# Patient Record
Sex: Female | Born: 1990 | Race: White | Hispanic: No | Marital: Single
Health system: Southern US, Community
[De-identification: ages and names within clinical notes are randomized; demographics above are authoritative.]

---

## 2020-12-06 ENCOUNTER — Other Ambulatory Visit: Payer: Self-pay

## 2020-12-06 ENCOUNTER — Emergency Department (HOSPITAL_COMMUNITY): Payer: Medicare Other

## 2020-12-06 ENCOUNTER — Emergency Department (HOSPITAL_COMMUNITY)
Admission: EM | Admit: 2020-12-06 | Discharge: 2020-12-07 | Disposition: A | Discharge status: Home / Self Care | Payer: Medicare Other | Attending: Emergency Medicine | Admitting: Emergency Medicine

## 2020-12-06 DIAGNOSIS — R109 Unspecified abdominal pain: Secondary | ICD-10-CM | POA: Diagnosis not present

## 2020-12-06 DIAGNOSIS — Z20822 Contact with and (suspected) exposure to covid-19: Secondary | ICD-10-CM | POA: Insufficient documentation

## 2020-12-06 DIAGNOSIS — W1812XA Fall from or off toilet with subsequent striking against object, initial encounter: Secondary | ICD-10-CM | POA: Insufficient documentation

## 2020-12-06 DIAGNOSIS — R519 Headache, unspecified: Secondary | ICD-10-CM | POA: Diagnosis not present

## 2020-12-06 DIAGNOSIS — M542 Cervicalgia: Secondary | ICD-10-CM | POA: Diagnosis present

## 2020-12-06 DIAGNOSIS — R29810 Facial weakness: Secondary | ICD-10-CM | POA: Insufficient documentation

## 2020-12-06 DIAGNOSIS — R110 Nausea: Secondary | ICD-10-CM | POA: Insufficient documentation

## 2020-12-06 DIAGNOSIS — R531 Weakness: Secondary | ICD-10-CM | POA: Diagnosis not present

## 2020-12-06 DIAGNOSIS — M061 Adult-onset Still's disease: Secondary | ICD-10-CM | POA: Insufficient documentation

## 2020-12-06 DIAGNOSIS — G43109 Migraine with aura, not intractable, without status migrainosus: Secondary | ICD-10-CM | POA: Diagnosis not present

## 2020-12-06 LAB — CBG MONITORING, ED: Glucose-Capillary: 128 mg/dL — ABNORMAL HIGH (ref 70–99)

## 2020-12-06 IMAGING — MR MR MRA HEAD W/O CM
3 series · 18 of 48 positions shown · non-contrast
Comparison: No pertinent prior exam.

Comparison made with prior head CT from earlier the same day.

CLINICAL DATA: Follow-up examination for acute stroke. Left-sided
weakness.

EXAM:
MRI HEAD WITHOUT CONTRAST
MRA HEAD WITHOUT CONTRAST
TECHNIQUE: Multiplanar, multi-echo pulse sequences of the brain and surrounding
structures were acquired without intravenous contrast. Angiographic
images of the Circle of Willis were acquired using MRA technique
without intravenous contrast.

[Series 2: ax (id) (id) · axial · 1.6mm · 0.43mm/px · z∈[-8,+87]mm · 11 of 134 slices shown]
[im 7/134]
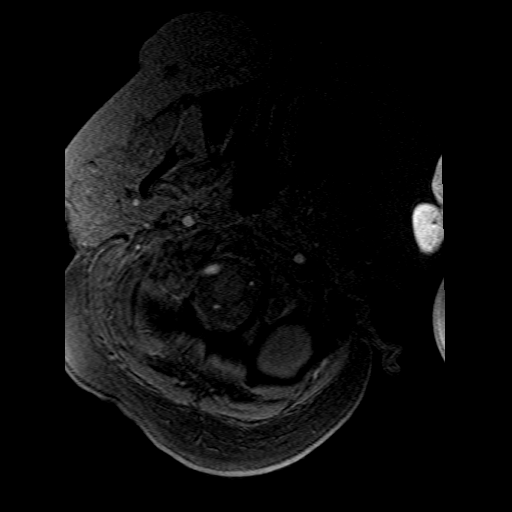
[im 20/134]
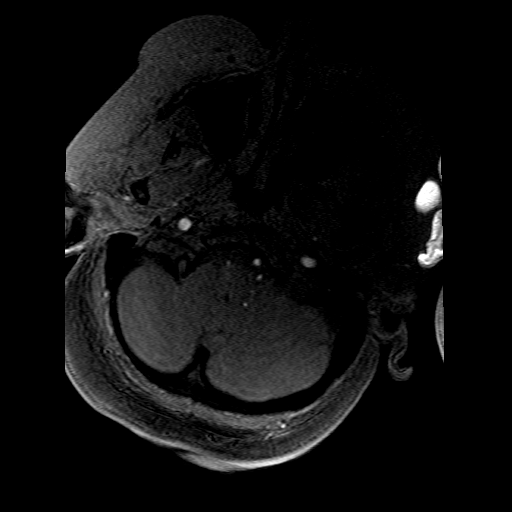
[im 24/134]
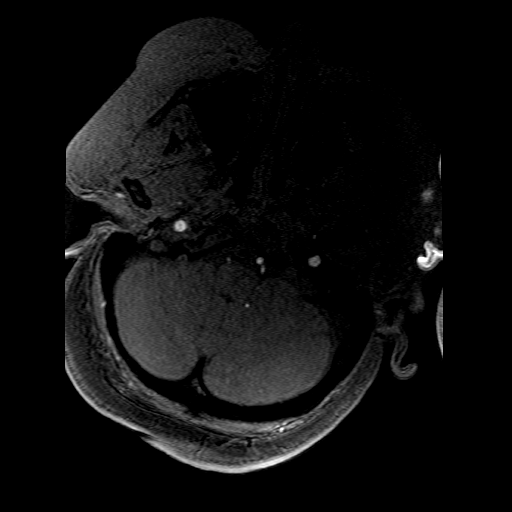
[im 40/134]
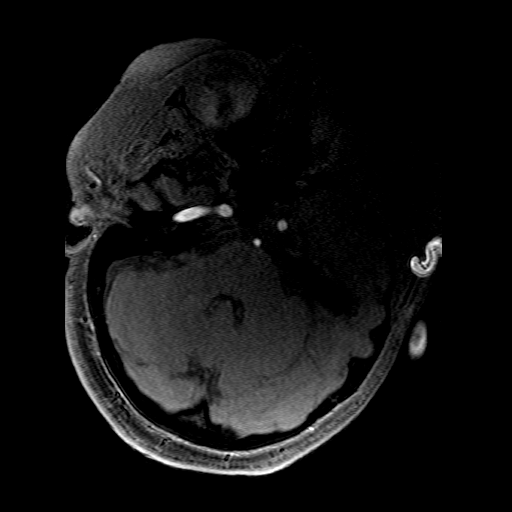
[im 57/134]
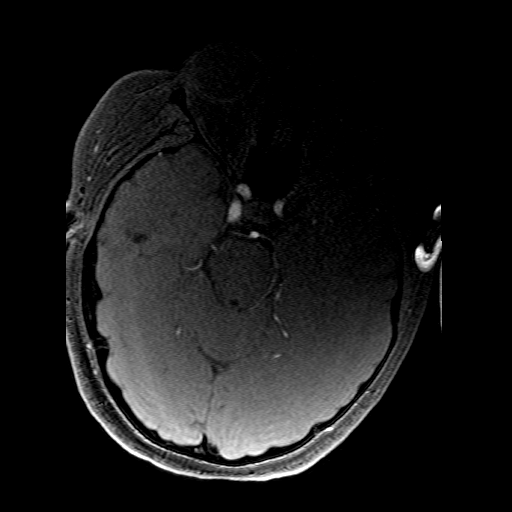
[im 67/134]
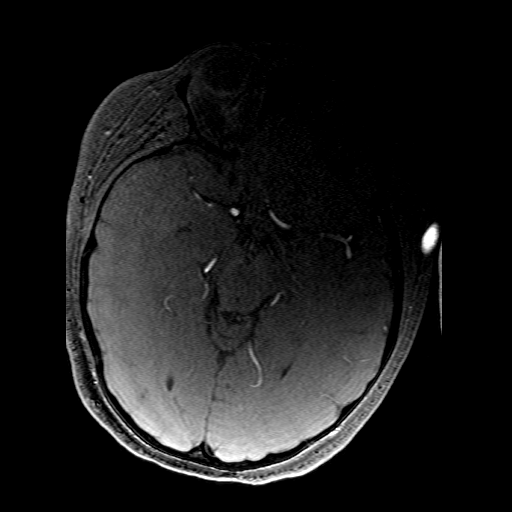
[im 77/134]
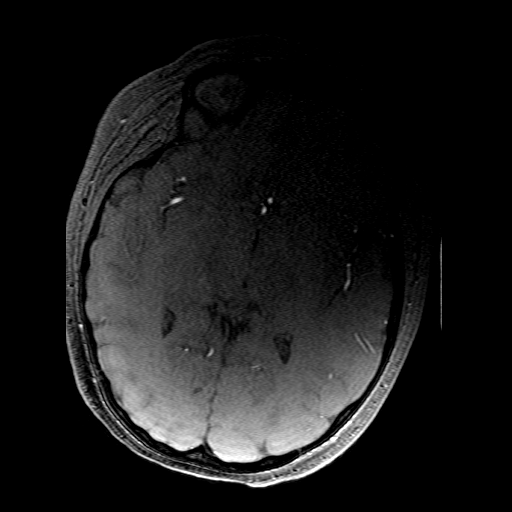
[im 94/134]
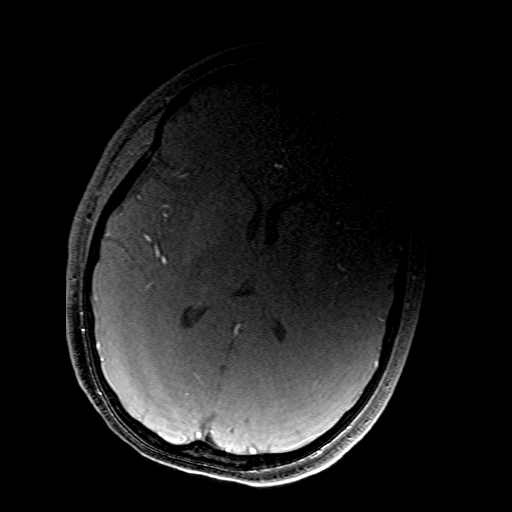
[im 110/134]
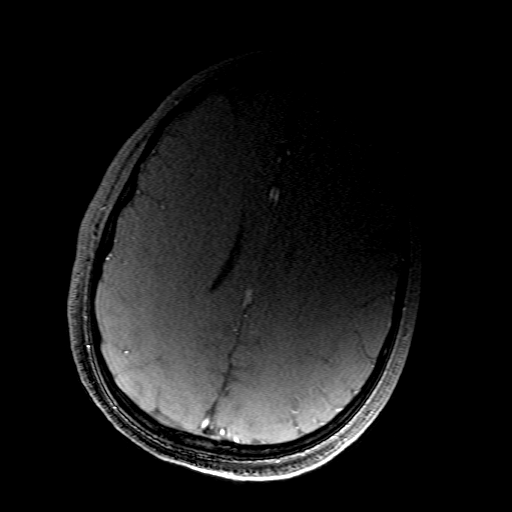
[im 114/134]
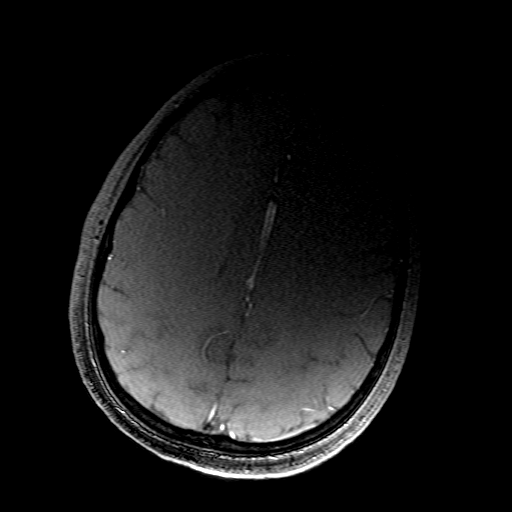
[im 127/134]
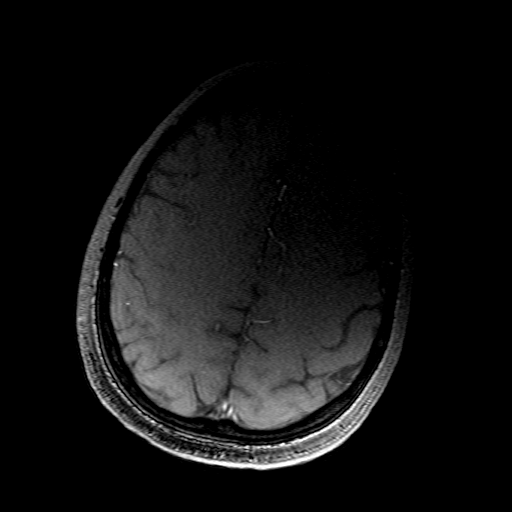

[Series 200: col:ax (id) (id) · axial · 1.6mm · 0.43mm/px · 1 of 1 slices shown]
[im 1/1]
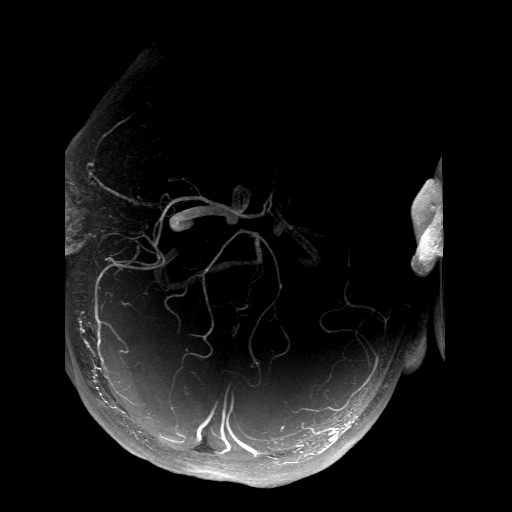

[Series 201: pjn:ax (id) (id) · sagittal · 1.6mm · 0.43mm/px · 6 of 19 slices shown]
[im 1/19]
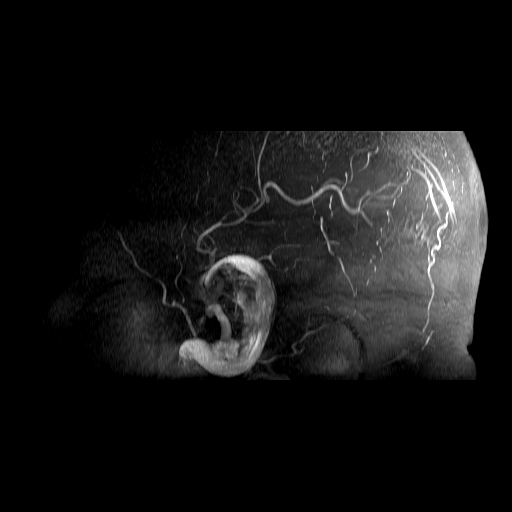
[im 4/19]
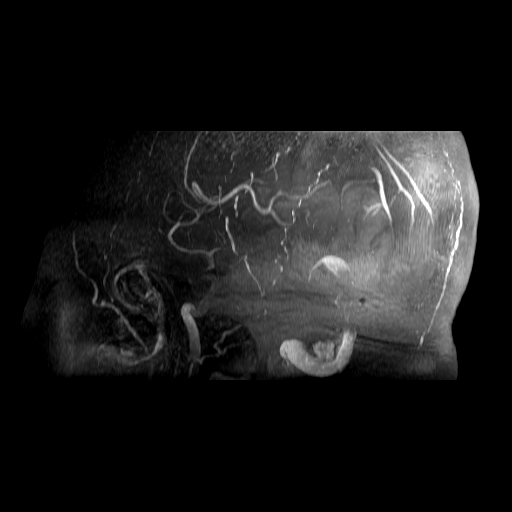
[im 8/19]
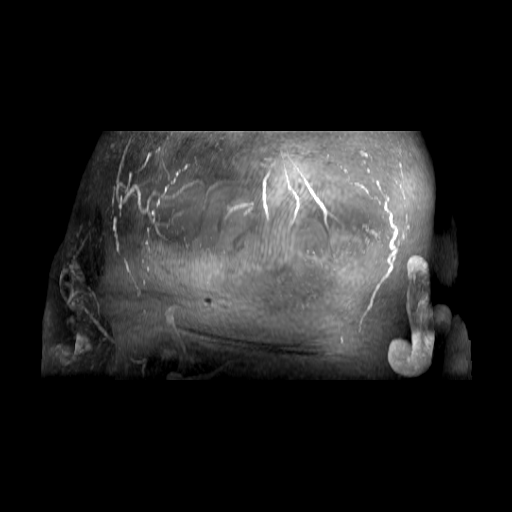
[im 11/19]
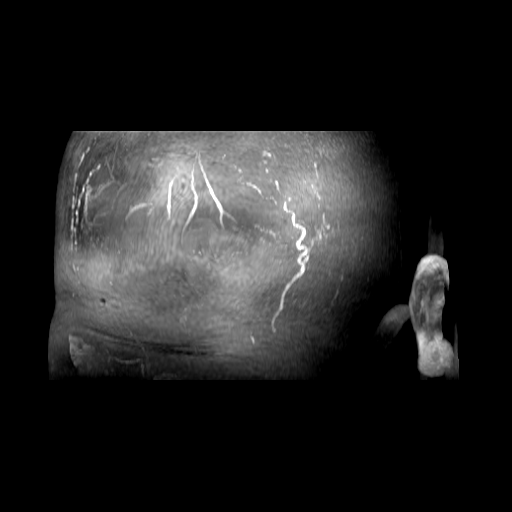
[im 15/19]
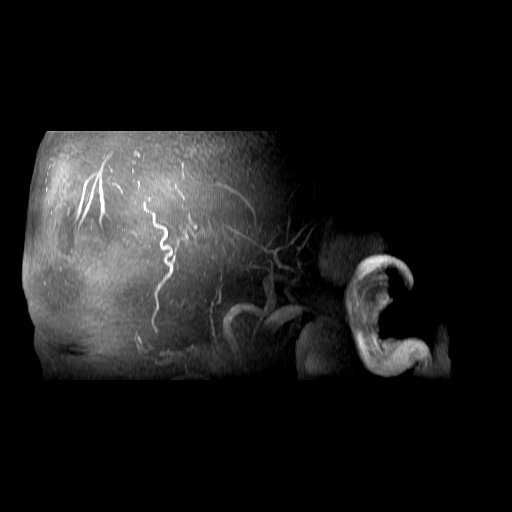
[im 19/19]
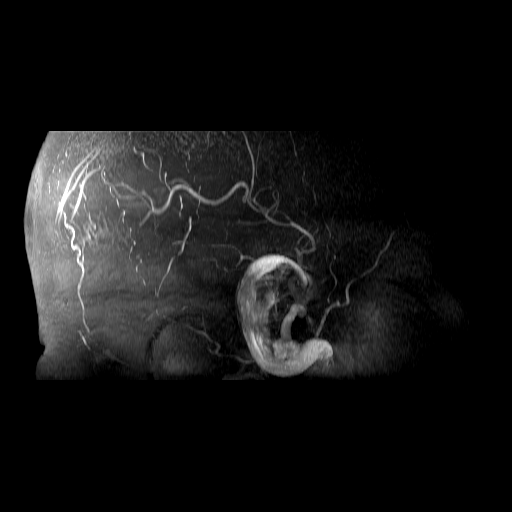

[18 of 48 positions shown; findings below may reference images not displayed]

FINDINGS: MRI HEAD FINDINGS

Brain: Examination is somewhat technically limited due to the
necessity of using a flex coil for this examination. Additionally, a
limited stroke protocol with diffusion weighted imaging, FLAIR, and
sagittal T1 weighted sequences only performed.

Diffusion weighted sequences demonstrate no evidence for acute or
subacute infarct. Gray-white matter differentiation well maintained.
No encephalomalacia to suggest chronic cortical infarction. No
secondary signs to suggest acute intracranial hemorrhage. Cerebral
volume within normal limits. A few apparent subcentimeter foci of
FLAIR hyperintensity involving the anterior frontal regions noted,
suspected to be artifactual on this limited exam. No other
significant cerebral white matter disease.

No mass lesion, midline shift or mass effect. No hydrocephalus or
extra-axial fluid collection. Pituitary gland and suprasellar region
within normal limits. Midline structures intact and normal.

Vascular: Major intracranial vascular flow voids are grossly
maintained.

Skull and upper cervical spine: Craniocervical junction within
normal limits. Bone marrow signal intensity normal. No scalp soft
tissue abnormality.

Sinuses/Orbits: Globes and orbital soft tissues grossly within
normal limits. Paranasal sinuses are clear. No mastoid effusion.

Other: None.

MRA HEAD FINDINGS

Anterior circulation: Examination somewhat technically limited by
the use of the flex coil.

Visualized distal cervical segments of the internal carotid arteries
are patent with symmetric antegrade flow. Petrous, cavernous, and
supraclinoid segments widely patent without stenosis or other
abnormality. A1 segments widely patent. Normal anterior
communicating artery complex. Anterior cerebral arteries patent to
their distal aspects without stenosis. Normal in stenosis or
occlusion. No proximal MCA branch occlusion. Distal MCA branches
perfused and symmetric.

Posterior circulation: Right vertebral artery strongly dominant and
widely patent to the vertebrobasilar junction. Hypoplastic left
vertebral artery patent as well. Right PICA origin patent. Left PICA
origin not well seen on this exam. Basilar patent to its distal
aspect without stenosis. Superior cerebellar arteries patent
bilaterally. Both PCAs primarily supplied via the basilar well
perfused or distal aspects. Small right posterior communicating
artery noted.

Anatomic variants: Dominant right vertebral artery. No aneurysm or
other vascular abnormality.
IMPRESSION: 1. Negative brain MRI. No acute intracranial infarct or other
abnormality.
2. Normal intracranial MRA. No large vessel occlusion,
hemodynamically significant stenosis, or other significant finding.

## 2020-12-06 IMAGING — CT CT CERVICAL SPINE W/O CM
3 of 4 series · 13 of 33 positions shown, 16 images · non-contrast
Comparison: None.

CLINICAL DATA: Status post trauma.

EXAM:
CT CERVICAL SPINE WITHOUT CONTRAST
TECHNIQUE: Multidetector CT imaging of the cervical spine was performed without
intravenous contrast. Multiplanar CT image reconstructions were also
generated.

[Series 4: c_spine 2.0 st · axial · 0.27mm/px · z∈[-293,-157]mm · 5 of 103 slices shown, 7 images]
[im 18/103  soft-tissue]
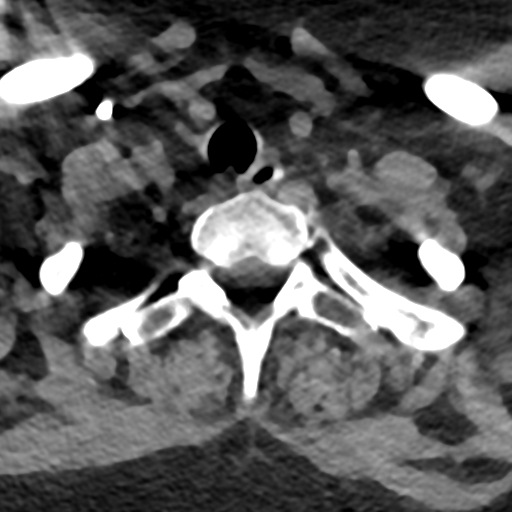
[im 18/103  bone]
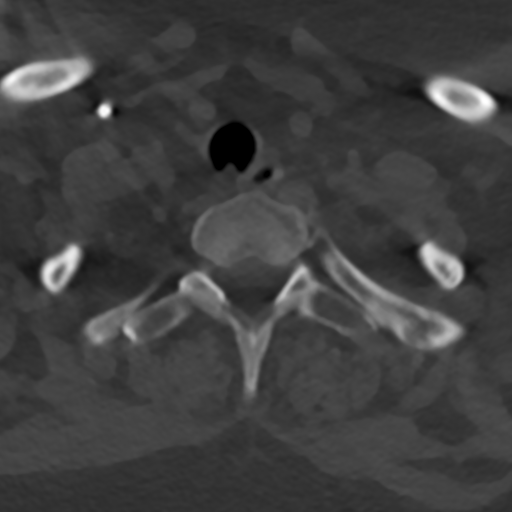
[im 35/103  bone]
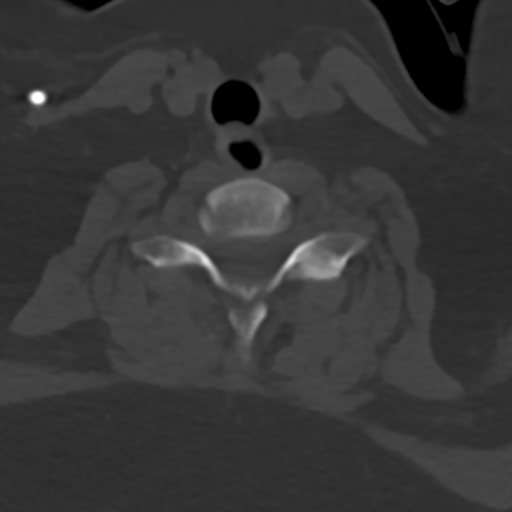
[im 52/103  bone]
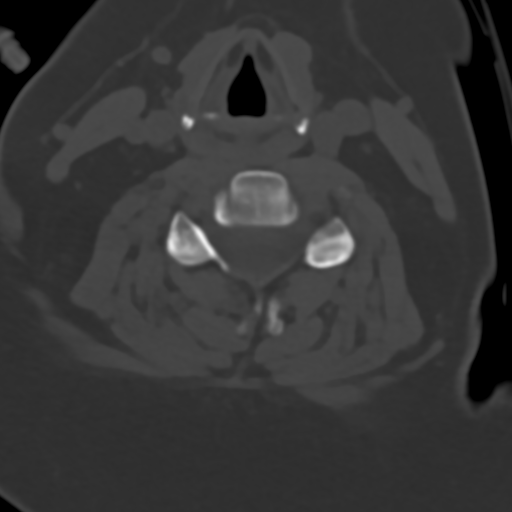
[im 69/103  bone]
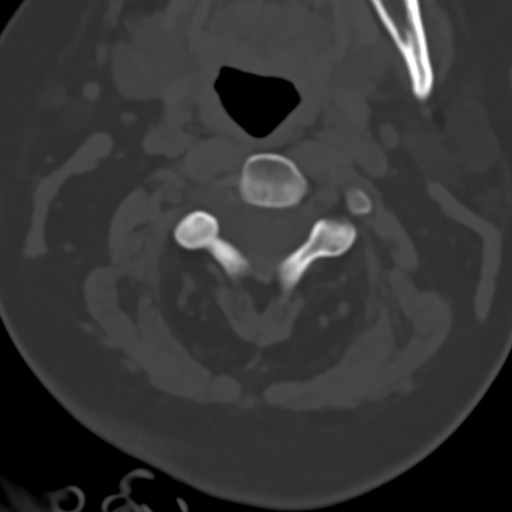
[im 86/103  soft-tissue]
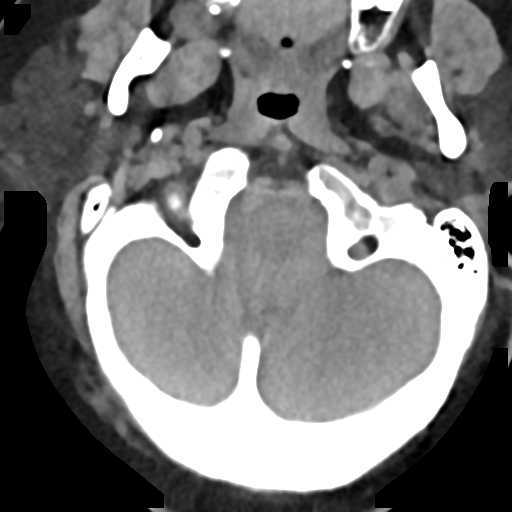
[im 86/103  bone]
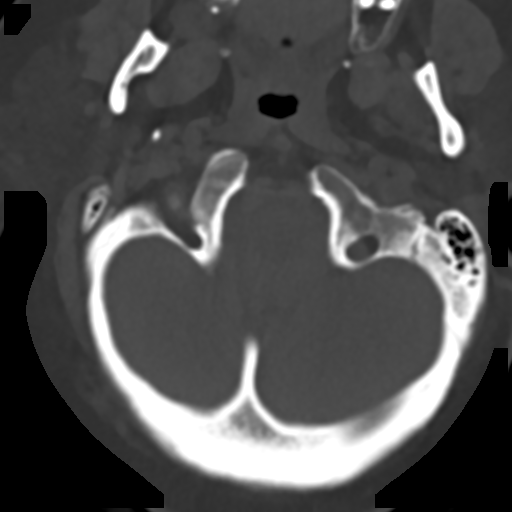

[Series 8: c_spine 2.0 sag bone · sagittal · 0.30mm/px · 5 of 48 slices shown, 6 images]
[im 16/48  bone]
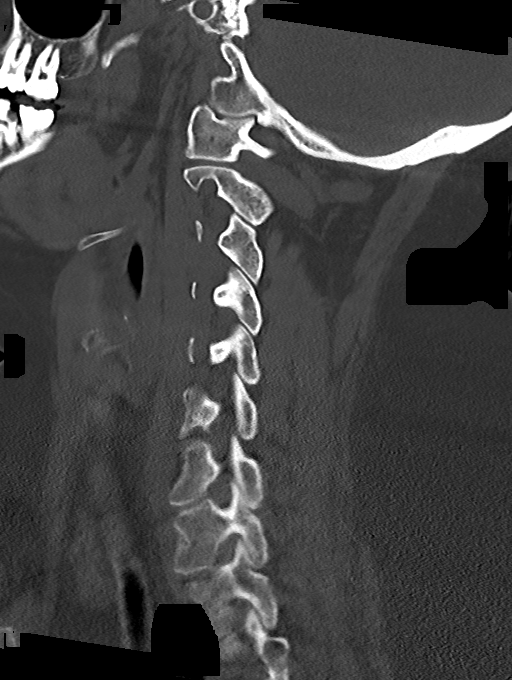
[im 20/48  bone]
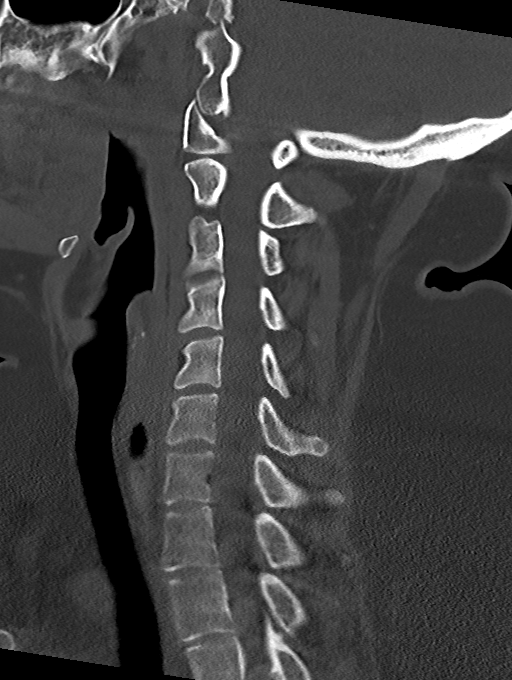
[im 24/48  soft-tissue]
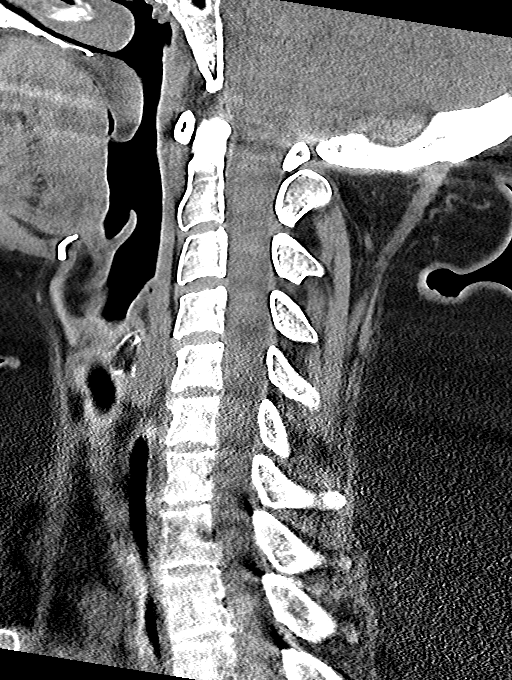
[im 24/48  bone]
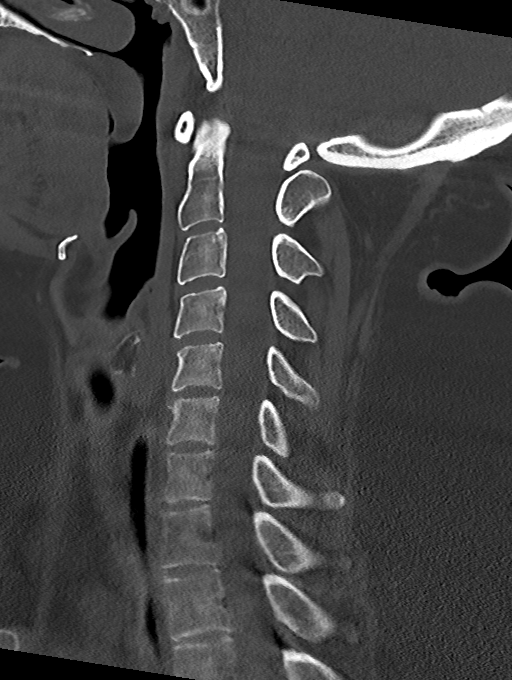
[im 28/48  bone]
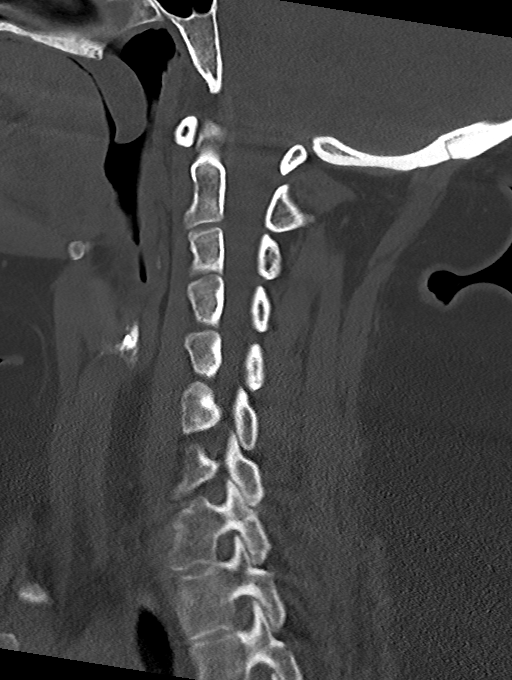
[im 32/48  bone]
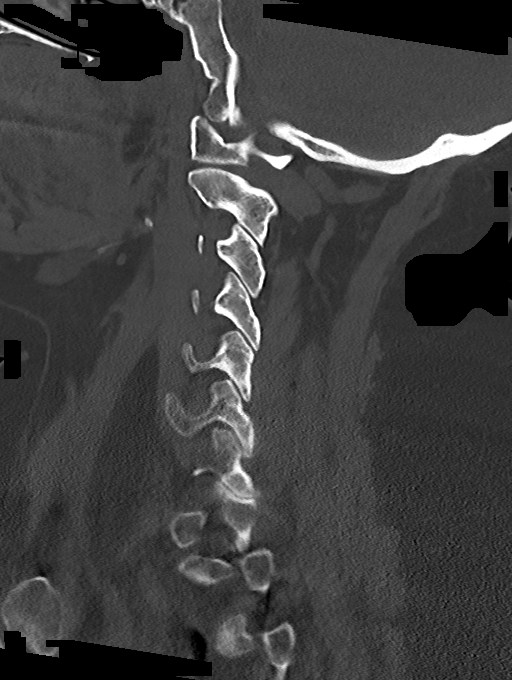

[Series 9: c_spine 2.0 cor bone · coronal · 0.30mm/px · 3 of 51 slices shown]
[im 11/51  bone]
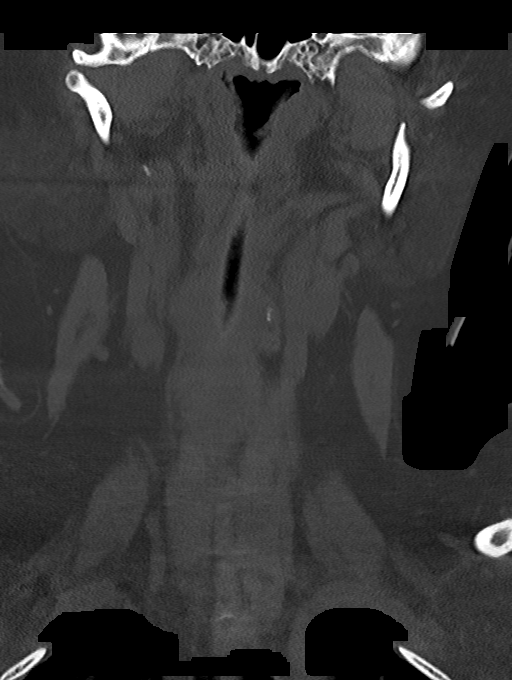
[im 21/51  bone]
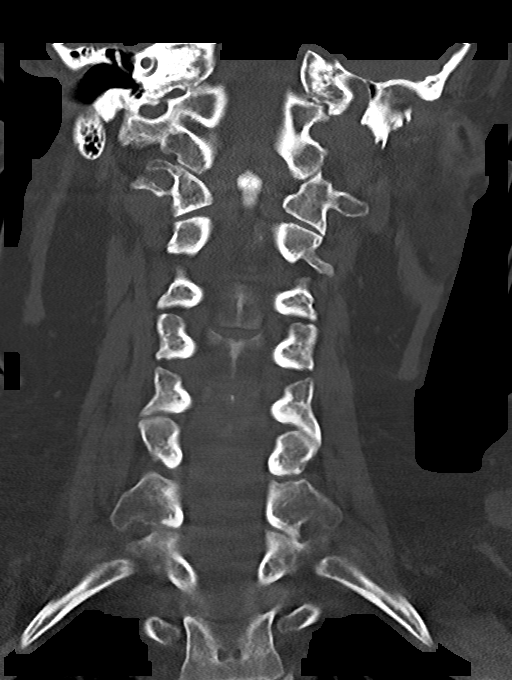
[im 31/51  bone]
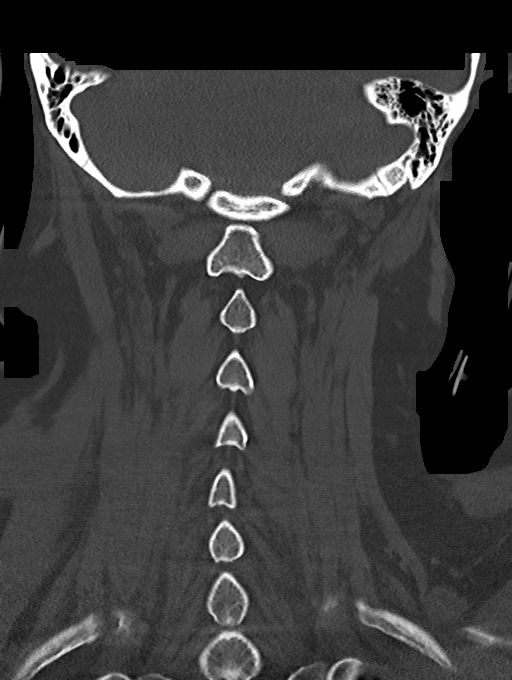

[13 of 33 positions shown; findings below may reference images not displayed]

FINDINGS: Alignment: Normal.

Skull base and vertebrae: No acute fracture. No primary bone lesion
or focal pathologic process.

Soft tissues and spinal canal: No prevertebral fluid or swelling. No
visible canal hematoma.

Disc levels: Normal multilevel endplates are seen with normal
multilevel intervertebral disc spaces.

Normal bilateral multilevel facet joints are noted.

Upper chest: Negative.

Other: None.
IMPRESSION: No acute fracture or subluxation of the cervical spine.

## 2020-12-06 IMAGING — CT CT HEAD CODE STROKE
4 series · 16 of 47 positions shown, 18 images · non-contrast
Comparison: None.

CLINICAL DATA: Code stroke. Initial evaluation for acute stroke,
left-sided deficits.

EXAM:
CT HEAD WITHOUT CONTRAST
TECHNIQUE: Contiguous axial images were obtained from the base of the skull
through the vertex without intravenous contrast.

[Series 3: head wo · axial · 0.41mm/px · z∈[-116,+4]mm · 7 of 34 slices shown, 9 images]
[im 5/34  brain]
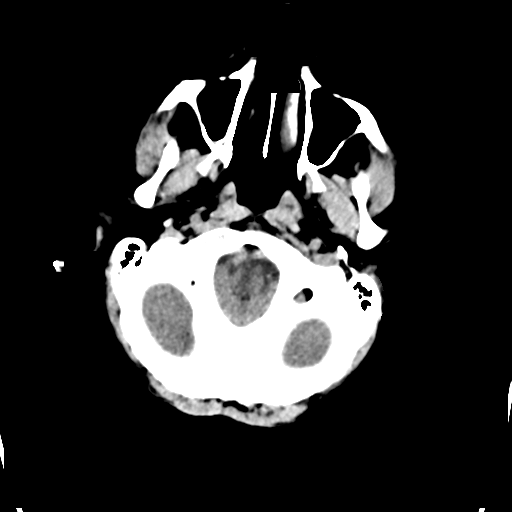
[im 5/34  bone]
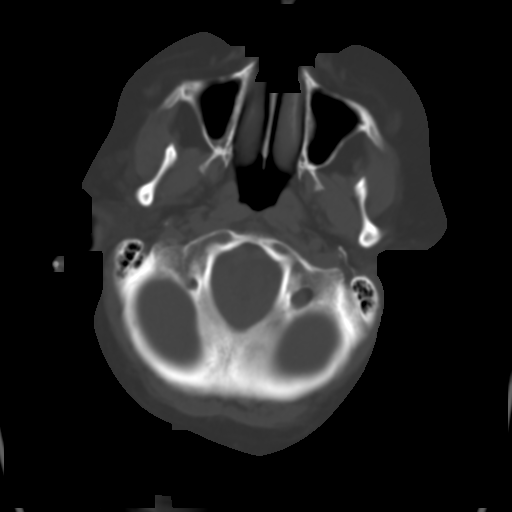
[im 9/34  brain]
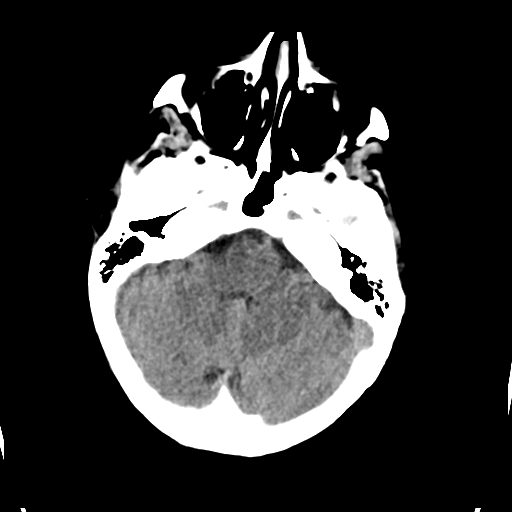
[im 13/34  brain]
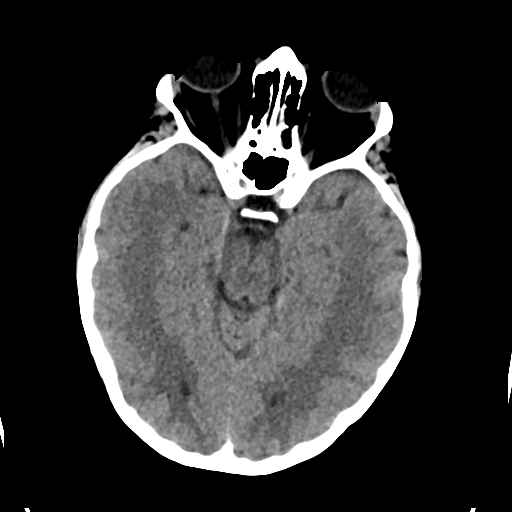
[im 17/34  brain]
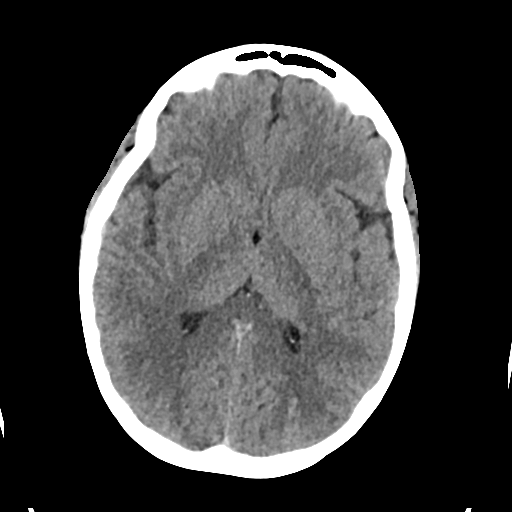
[im 21/34  brain]
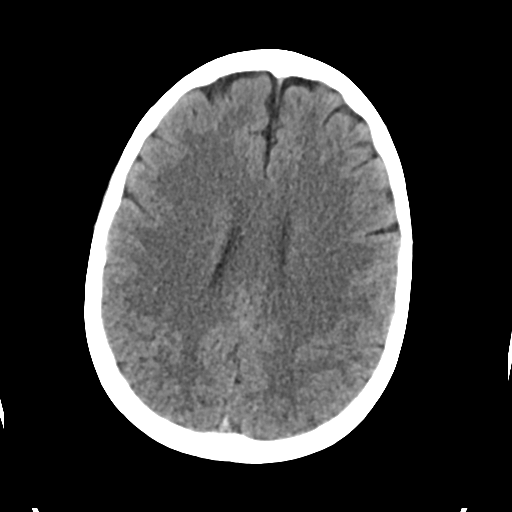
[im 21/34  bone]
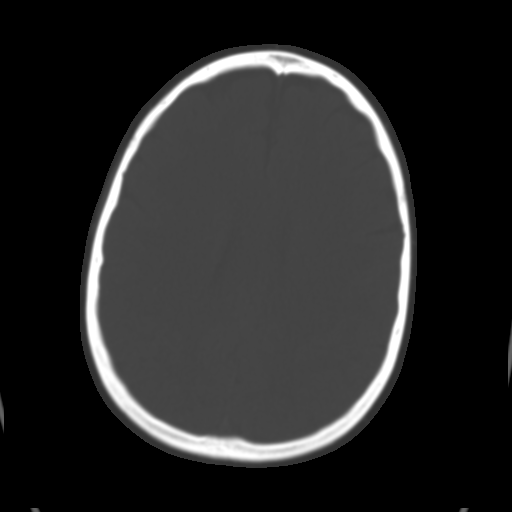
[im 25/34  brain]
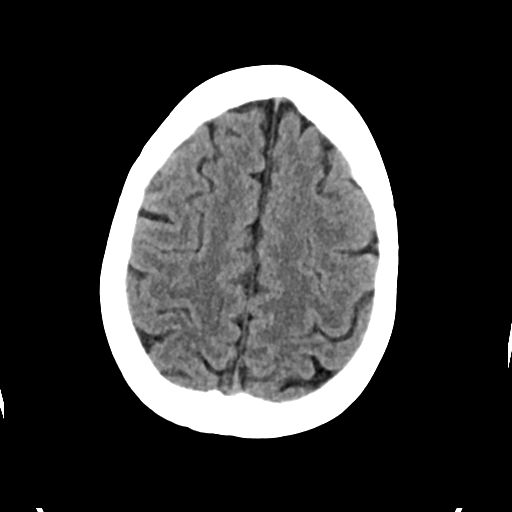
[im 29/34  brain]
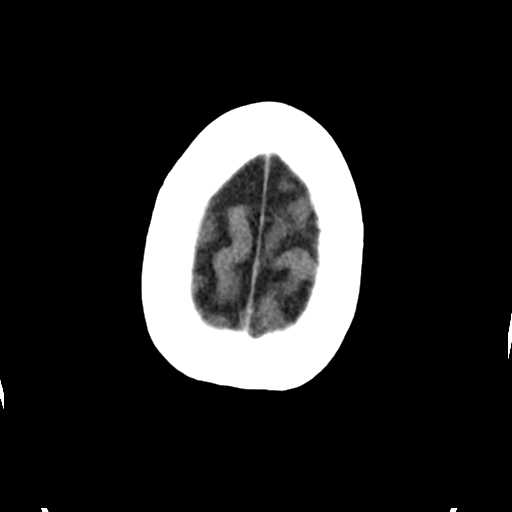

[Series 4: head bone · axial · 0.41mm/px · z∈[-120,-86]mm · 3 of 85 slices shown]
[im 9/85  bone]
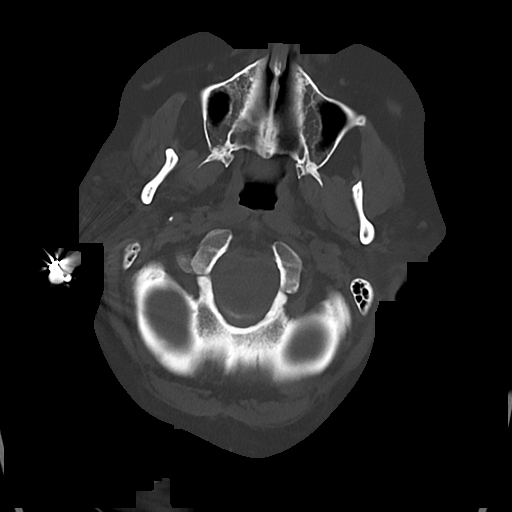
[im 17/85  bone]
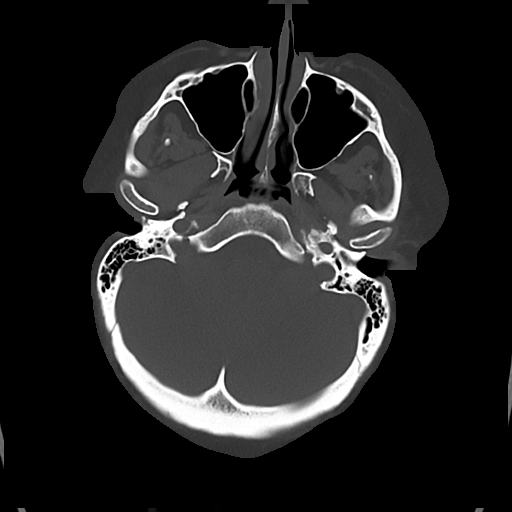
[im 26/85  bone]
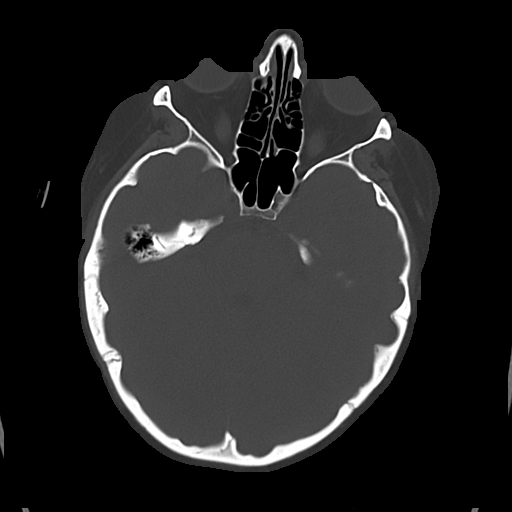

[Series 5: cor soft · coronal · 0.33mm/px · 3 of 69 slices shown]
[im 23/69  brain]
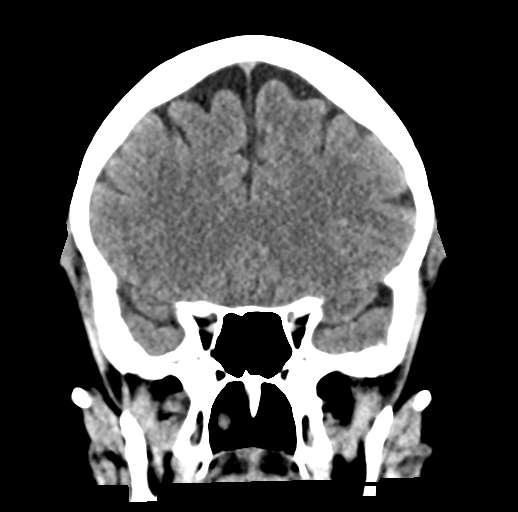
[im 31/69  brain]
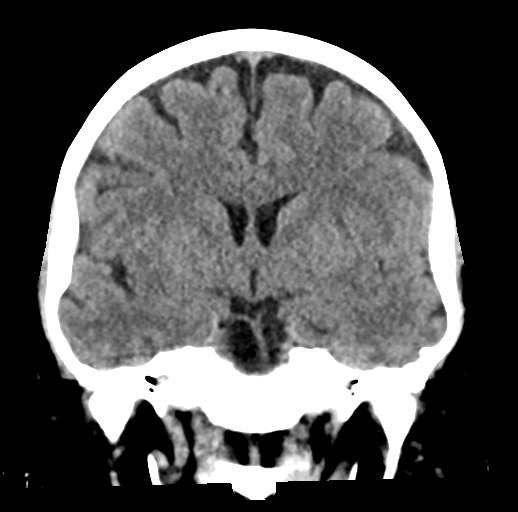
[im 38/69  brain]
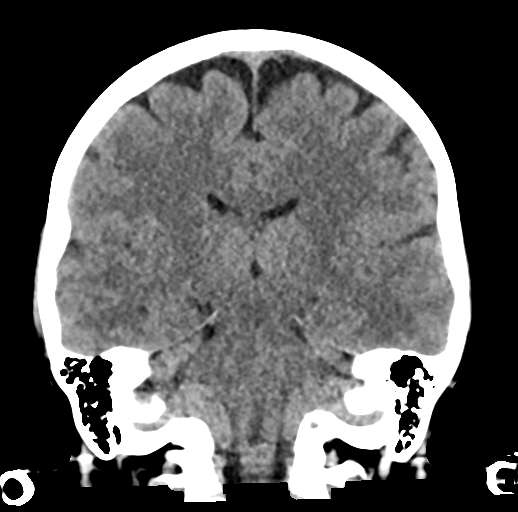

[Series 6: sag soft · sagittal · 0.33mm/px · 3 of 60 slices shown]
[im 20/60  brain]
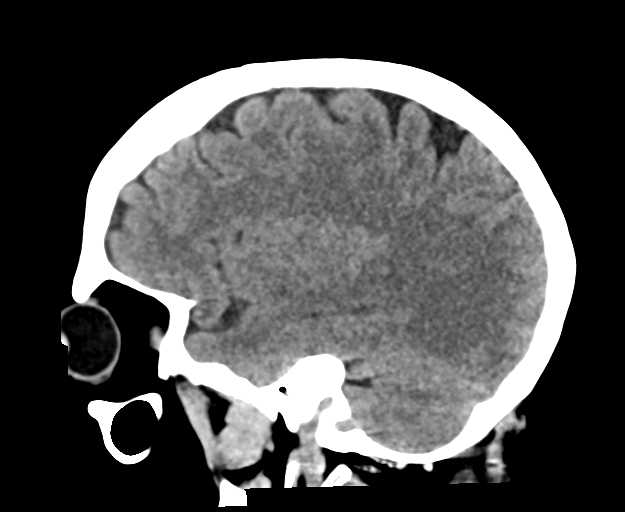
[im 30/60  brain]
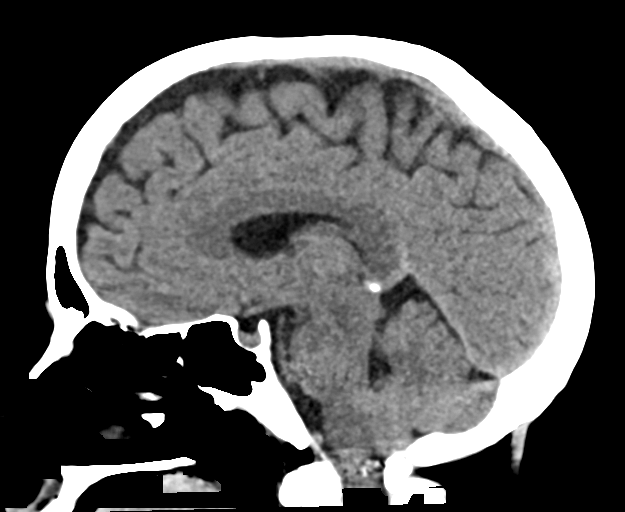
[im 40/60  brain]
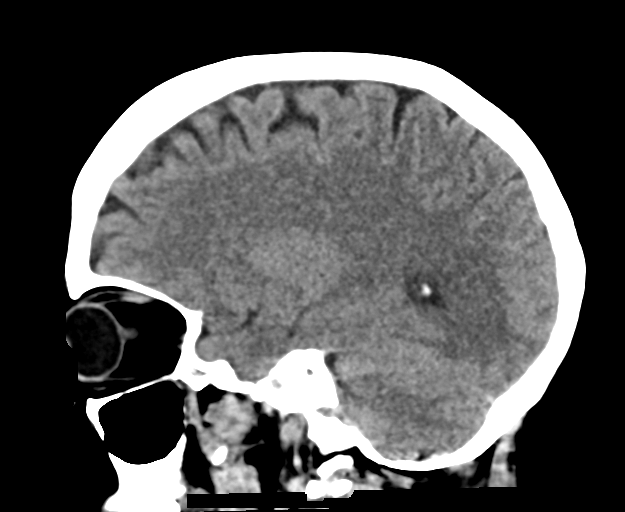

[16 of 47 positions shown; findings below may reference images not displayed]

FINDINGS: Brain: Cerebral volume within normal limits for patient age.

No evidence for acute intracranial hemorrhage. No findings to
suggest acute large vessel territory infarct. No mass lesion,
midline shift, or mass effect. Ventricles are normal in size without
evidence for hydrocephalus. No extra-axial fluid collection
identified.

Vascular: No hyperdense vessel identified.

Skull: Scalp soft tissues demonstrate no acute abnormality.
Calvarium intact.

Sinuses/Orbits: Globes and orbital soft tissues within normal
limits.

Visualized paranasal sinuses are clear. No mastoid effusion.

ASPECTS ([place] Stroke Program Early CT Score)

- Ganglionic level infarction (caudate, lentiform nuclei, internal
capsule, insula, M1-M3 cortex): 7

- Supraganglionic infarction (M4-M6 cortex): 3

Total score (0-10 with 10 being normal): 10
IMPRESSION: 1. Negative head CT.  No acute intracranial abnormality.
2. ASPECTS is 10.

These results were communicated to Dr. BLADIMIROS at [DATE] on
[DATE] by text page via the [employer] messaging system.

## 2020-12-06 IMAGING — MR MR HEAD W/O CM
4 of 6 series · 13 of 48 positions shown · non-contrast
Comparison: No pertinent prior exam.

Comparison made with prior head CT from earlier the same day.

CLINICAL DATA: Follow-up examination for acute stroke. Left-sided
weakness.

EXAM:
MRI HEAD WITHOUT CONTRAST
MRA HEAD WITHOUT CONTRAST
TECHNIQUE: Multiplanar, multi-echo pulse sequences of the brain and surrounding
structures were acquired without intravenous contrast. Angiographic
images of the Circle of Willis were acquired using MRA technique
without intravenous contrast.

[Series 2: DWI · axial · 3.0mm · 1.02mm/px · z∈[+22,+134]mm · 3 of 106 slices shown (1 of 2)]
[im 15/106]
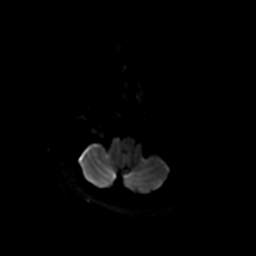
[im 57/106]
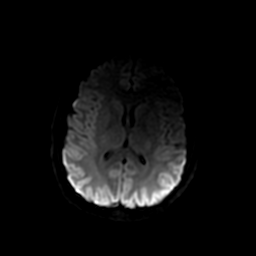
[im 92/106]
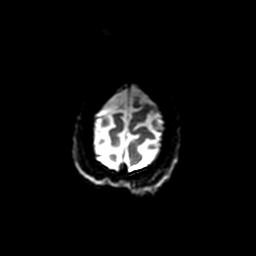

[Series 3: DWI · coronal · 5.0mm · 1.02mm/px · 3 of 74 slices shown (2 of 2)]
[im 8/74]
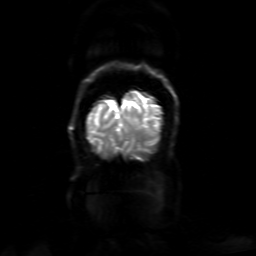
[im 37/74]
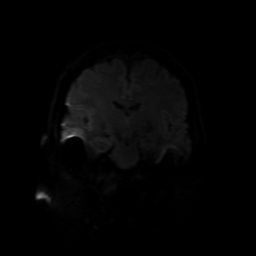
[im 66/74]
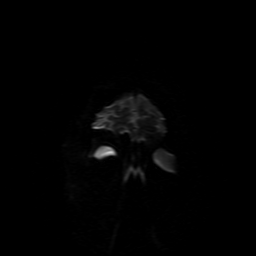

[Series 4: FLAIR · axial · 3.0mm · 0.41mm/px · z∈[+7,+153]mm · 4 of 26 slices shown (1 of 2)]
[im 1/26]
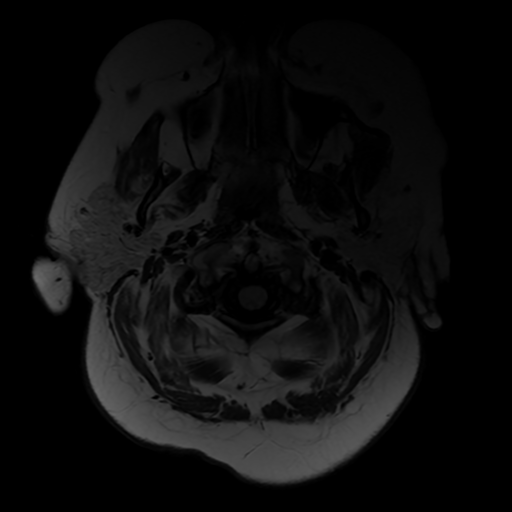
[im 9/26]
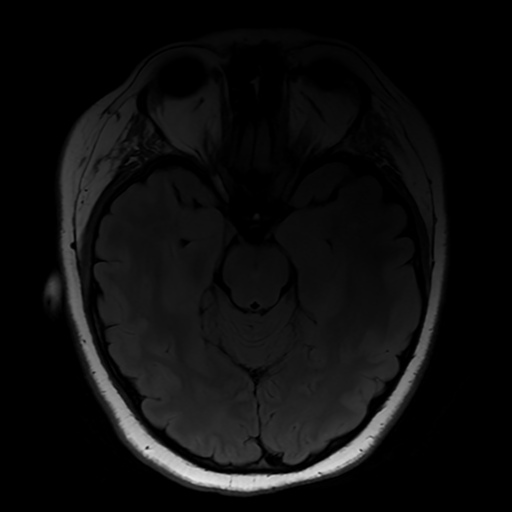
[im 17/26]
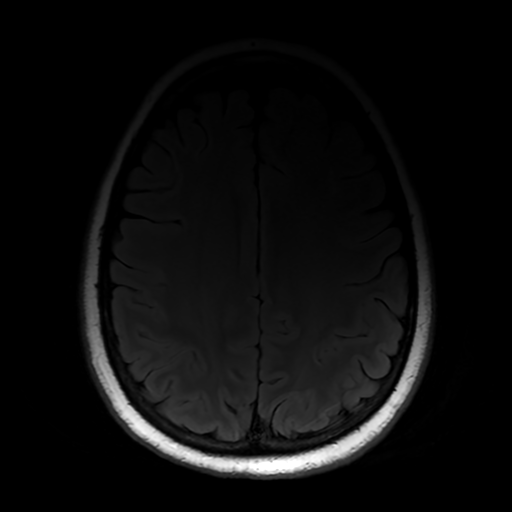
[im 26/26]
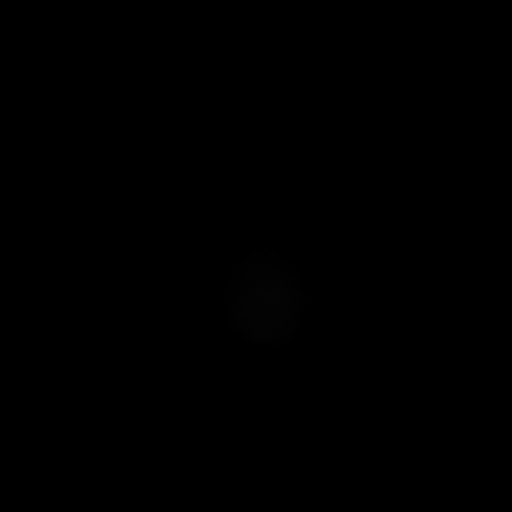

[Series 5: FLAIR · sagittal · 5.0mm · 0.23mm/px · 3 of 28 slices shown (2 of 2)]
[im 1/28]
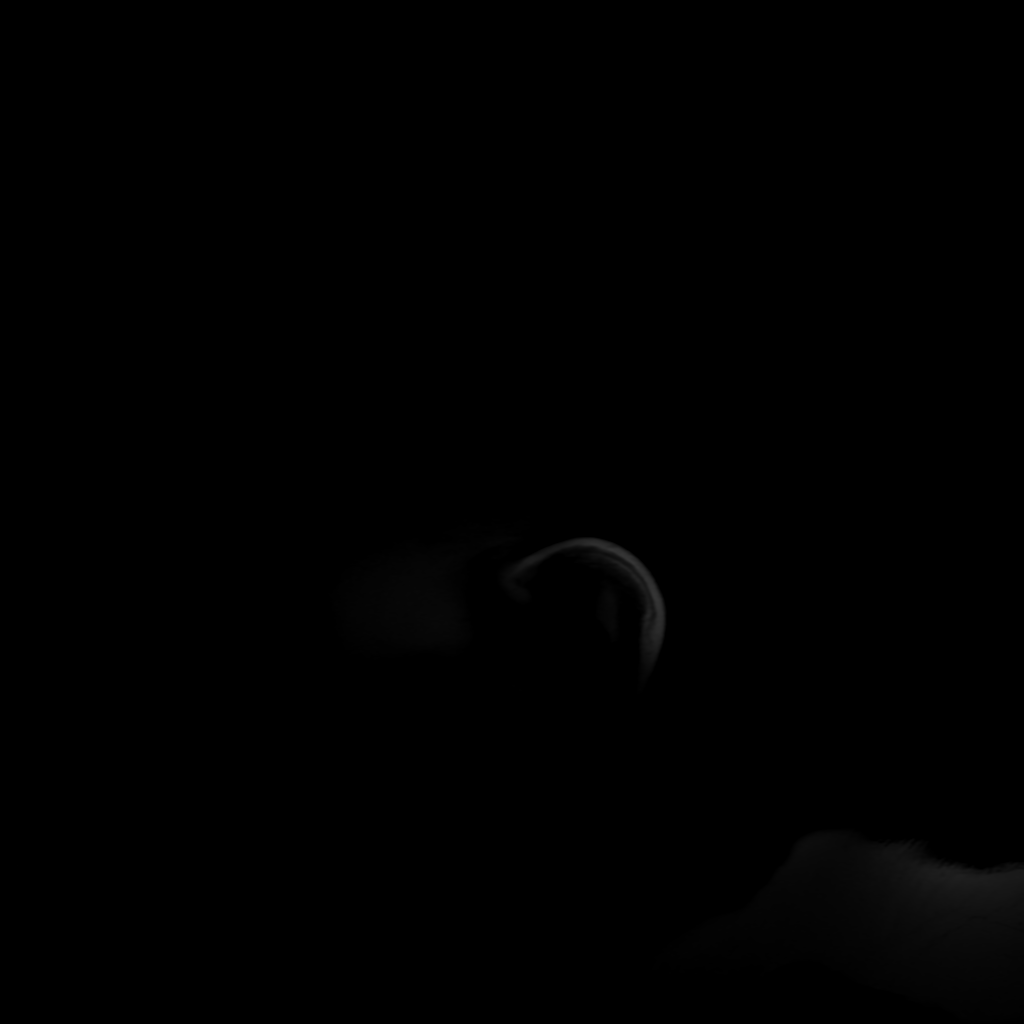
[im 19/28]
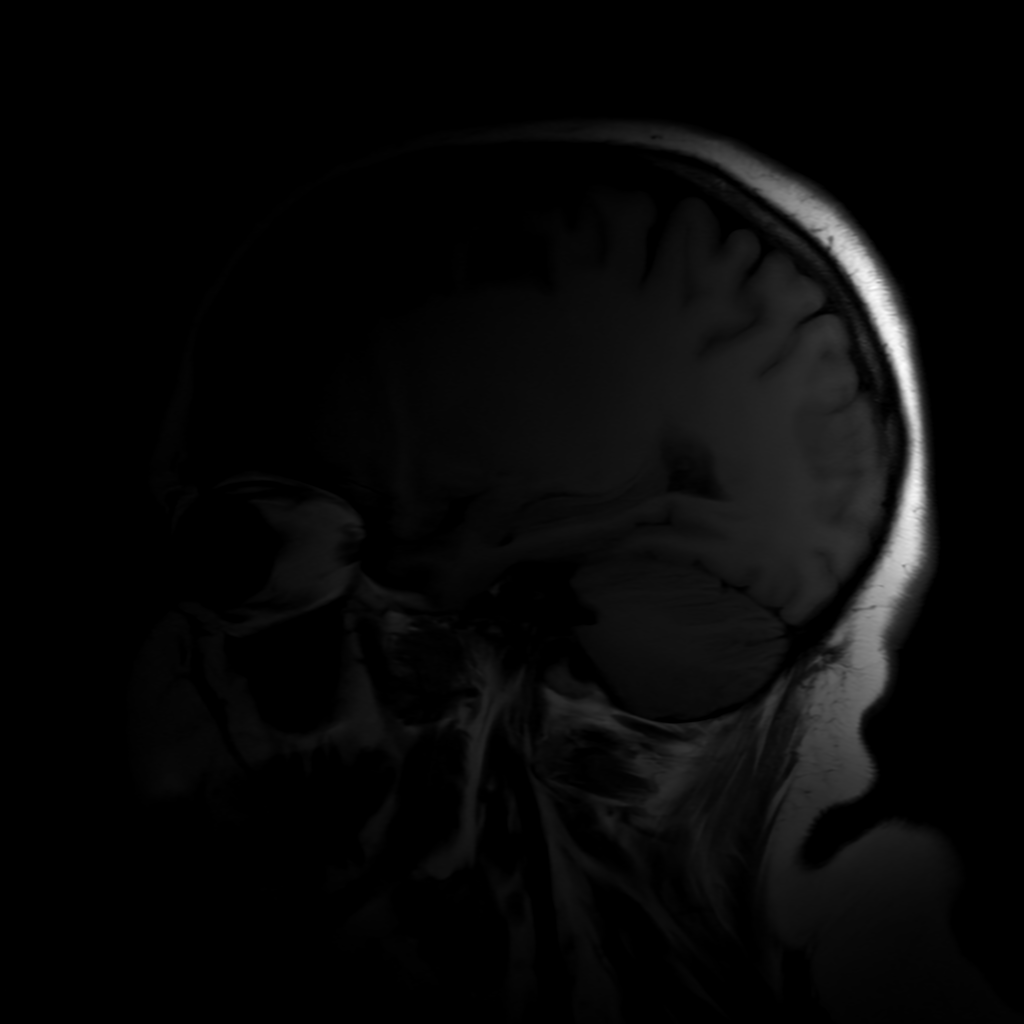
[im 28/28]
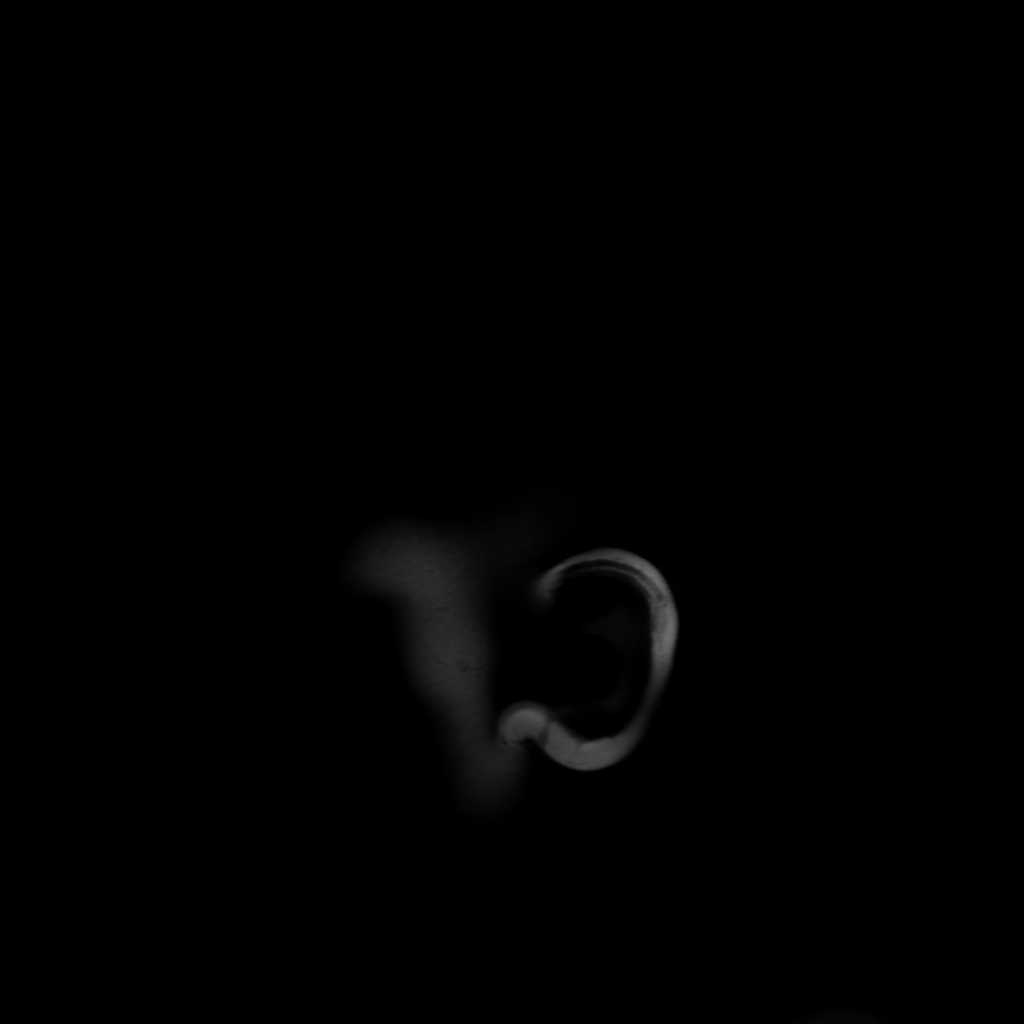

[13 of 48 positions shown; findings below may reference images not displayed]

FINDINGS: MRI HEAD FINDINGS

Brain: Examination is somewhat technically limited due to the
necessity of using a flex coil for this examination. Additionally, a
limited stroke protocol with diffusion weighted imaging, FLAIR, and
sagittal T1 weighted sequences only performed.

Diffusion weighted sequences demonstrate no evidence for acute or
subacute infarct. Gray-white matter differentiation well maintained.
No encephalomalacia to suggest chronic cortical infarction. No
secondary signs to suggest acute intracranial hemorrhage. Cerebral
volume within normal limits. A few apparent subcentimeter foci of
FLAIR hyperintensity involving the anterior frontal regions noted,
suspected to be artifactual on this limited exam. No other
significant cerebral white matter disease.

No mass lesion, midline shift or mass effect. No hydrocephalus or
extra-axial fluid collection. Pituitary gland and suprasellar region
within normal limits. Midline structures intact and normal.

Vascular: Major intracranial vascular flow voids are grossly
maintained.

Skull and upper cervical spine: Craniocervical junction within
normal limits. Bone marrow signal intensity normal. No scalp soft
tissue abnormality.

Sinuses/Orbits: Globes and orbital soft tissues grossly within
normal limits. Paranasal sinuses are clear. No mastoid effusion.

Other: None.

MRA HEAD FINDINGS

Anterior circulation: Examination somewhat technically limited by
the use of the flex coil.

Visualized distal cervical segments of the internal carotid arteries
are patent with symmetric antegrade flow. Petrous, cavernous, and
supraclinoid segments widely patent without stenosis or other
abnormality. A1 segments widely patent. Normal anterior
communicating artery complex. Anterior cerebral arteries patent to
their distal aspects without stenosis. Normal in stenosis or
occlusion. No proximal MCA branch occlusion. Distal MCA branches
perfused and symmetric.

Posterior circulation: Right vertebral artery strongly dominant and
widely patent to the vertebrobasilar junction. Hypoplastic left
vertebral artery patent as well. Right PICA origin patent. Left PICA
origin not well seen on this exam. Basilar patent to its distal
aspect without stenosis. Superior cerebellar arteries patent
bilaterally. Both PCAs primarily supplied via the basilar well
perfused or distal aspects. Small right posterior communicating
artery noted.

Anatomic variants: Dominant right vertebral artery. No aneurysm or
other vascular abnormality.
IMPRESSION: 1. Negative brain MRI. No acute intracranial infarct or other
abnormality.
2. Normal intracranial MRA. No large vessel occlusion,
hemodynamically significant stenosis, or other significant finding.

## 2020-12-06 MED ORDER — SODIUM CHLORIDE 0.9 % IV SOLN
25.0000 mg | Freq: Four times a day (QID) | INTRAVENOUS | Status: DC | PRN
  Administered 2020-12-07: 25 mg via INTRAVENOUS
  Filled 2020-12-06 (×2): qty 1

## 2020-12-06 MED ORDER — SODIUM CHLORIDE 0.9% FLUSH: 3.0000 mL | Freq: Once | INTRAVENOUS | Status: DC

## 2020-12-06 MED ORDER — MIDAZOLAM HCL 2 MG/2ML IJ SOLN
INTRAMUSCULAR | Status: AC
  Administered 2020-12-06: 2 mg via INTRAMUSCULAR
  Filled 2020-12-06: qty 2

## 2020-12-06 MED ORDER — MIDAZOLAM HCL 2 MG/2ML IJ SOLN: 2.0000 mg | Freq: Once | INTRAMUSCULAR | Status: AC

## 2020-12-06 MED ORDER — DIPHENHYDRAMINE HCL 50 MG/ML IJ SOLN
12.5000 mg | Freq: Once | INTRAMUSCULAR | Status: AC
  Administered 2020-12-06: 12.5 mg via INTRAVENOUS
  Filled 2020-12-06: qty 1

## 2020-12-06 NOTE — ED Provider Notes (Signed)
[facility] Provider Note  CSN: [id number] Arrival date & time: 12/06/20 2027    History No chief complaint on file.   Latoya Schwartz is a 30 y.o. 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 Also was getting lovenox as DVT prophylaxis, and requiring significant amount of pain medications. She was discharged today but reports while she was on her way home when she began feeling abdominal pain and nausea. She stopped at a local [employer] on her way home to [facility] to use the bathroom. She apparently had a brief syncopal episode, hitting her head and falling off the commode. She was brought by EMS who reported she had a L sided facial droop and L arm/leg weakness. She was activated as a Code Stroke. Limited history otherwise. Level 5 caveat applies.    No past medical history on file.  No family history on file.      Home Medications Prior to Admission medications   Not on File     Allergies    Patient has no allergy information on record.   Review of Systems   Review of Systems Unable to assess due to mental status.    Physical Exam Wt 105.2 kg   Physical Exam Vitals and nursing note reviewed.  Constitutional:      Appearance: Normal appearance.  HENT:     Head: Normocephalic and atraumatic.     Nose: Nose normal.     Mouth/Throat:     Mouth: Mucous membranes are moist.  Eyes:     Extraocular Movements: Extraocular movements intact.     Conjunctiva/sclera: Conjunctivae normal.  Cardiovascular:     Rate and Rhythm: Normal rate.  Pulmonary:     Effort: Pulmonary effort is normal.     Breath sounds: Normal breath sounds.  Abdominal:     General: Abdomen is flat.     Palpations: Abdomen is soft.     Tenderness: There is no abdominal tenderness.  Musculoskeletal:        General: No swelling. Normal range of motion.     Cervical back: Neck  supple.  Skin:    General: Skin is warm and dry.  Neurological:     Mental Status: She is alert.     Comments: Alert and oriented, possible L sided facial droop vs poor effort. R arm/leg normal strength, L arm/leg flaccid but protects her face when dropped. For full NIHSS, please see Neuro team notes.   Psychiatric:        Mood and Affect: Mood normal.     ED Results / Procedures / Treatments   Labs (all labs ordered are listed, but only abnormal results are displayed) Labs Reviewed  CBG MONITORING, ED - Abnormal; Notable for the following components:      Result Value   Glucose-Capillary 128 (*)    All other components within normal limits  RESP PANEL BY RT-PCR (FLU A&B, COVID) ARPGX2  PROTIME-INR  APTT  CBC  DIFFERENTIAL  COMPREHENSIVE METABOLIC PANEL  I-STAT CHEM 8, ED  I-STAT BETA HCG BLOOD, ED ([facility], [facility], [facility] ONLY)    EKG None  Radiology CT HEAD CODE STROKE WO CONTRAST  Result Date: 12/06/2020 CLINICAL DATA:  Code stroke. Initial evaluation for acute stroke, left-sided deficits. EXAM: CT HEAD WITHOUT CONTRAST TECHNIQUE: Contiguous axial images were obtained from the base of the skull through the vertex without intravenous contrast. COMPARISON:  None.  FINDINGS: Brain: Cerebral volume within normal limits for patient age. No evidence for acute intracranial hemorrhage. No findings to suggest acute large vessel territory infarct. No mass lesion, midline shift, or mass effect. Ventricles are normal in size without evidence for hydrocephalus. No extra-axial fluid collection identified. Vascular: No hyperdense vessel identified. Skull: Scalp soft tissues demonstrate no acute abnormality. Calvarium intact. Sinuses/Orbits: Globes and orbital soft tissues within normal limits. Visualized paranasal sinuses are clear. No mastoid effusion. ASPECTS [facility] Stroke Program Early CT Score) - Ganglionic level infarction (caudate, lentiform nuclei, internal capsule, insula, M1-M3 cortex): 7 -  Supraganglionic infarction (M4-M6 cortex): 3 Total score (0-10 with 10 being normal): 10 IMPRESSION: 1. Negative head CT.  No acute intracranial abnormality. 2. ASPECTS is 10. These results were communicated to Dr. Amada Jupiter at 8:43 pm on 12/06/2020 by text page via the [facility] messaging system. Electronically Signed   By: Rise Mu M.D.   On: 12/06/2020 20:44    Procedures Procedures  Medications Ordered in the ED Medications  sodium chloride flush (NS) 0.9 % injection 3 mL (has no administration in time range)     MDM Rules/Calculators/A&P MDM  Patient taken directly to CT on arrival, initial head CT is neg. CTA pending, Neurology planning rapid MRI but feels this is unlikely to be acute CVA.  ED Course  I have reviewed the triage vital signs and the nursing notes.  Pertinent labs & imaging results that were available during my care of the patient were reviewed by me and considered in my medical decision making (see chart for details).  Clinical Course as of 12/06/20 2305  Thu Dec 06, 2020  2302 Care of the patient signed out to Dr. Adela Lank at the change of shift.  [CS]    Clinical Course User Index [CS] Pollyann Savoy, MD    Final Clinical Impression(s) / ED Diagnoses Final diagnoses:  Neck pain    Rx / DC Orders ED Discharge Orders     None        Pollyann Savoy, MD 12/06/20 [phone]

## 2020-12-06 NOTE — Consult Note (Signed)
Neurology Consultation Reason for Consult: Left-sided weakness Referring Physician: Bernette Mayers, C  CC: Left sided weakness  History is obtained from:patient  HPI: Latoya Schwartz is a 30 y.o. female with a history of stills disease, recently admitted at [facility] who was in her normal state of health upon being discharged this afternoon.  She was on her way home when she stopped at a [employer] because of nausea and headache, but fell in the [employer] and may have lost consciousness briefly.  Following this, she noticed left-sided weakness and was brought in by EMS as a code stroke.  On evaluation, she complained of significant left-sided paresthesia and pain including her face.  She had left facial weakness when asked to smile.  There were some inconsistencies on exam and therefore there is question as far as the etiology of her weakness.  Therefore she was taken to MRI prior to consideration of IV tenecteplase and MRI was negative.  She complains of severe left-sided headache, photophobia, nausea.  She has never had a headache this severe, but has had occasional headaches in the past.  Her mother has a history of migraines.  LKW: 7 PM tpa given?: no, no stroke on MRI    ROS: A 14 point ROS was performed and is negative except as noted in the HPI.  PMHx: Still's disease  FHx: Migraine  Social History:  Recently id[place]ged form [employer], lives in [place]  Exam: Current vital signs: Wt 105.2 kg  Vital signs in last 24 hours: Weight:  [105.2 kg] 105.2 kg (10/27 2000)   Physical Exam  Constitutional: Appears cushingoid Psych: Affect appropriate to situation Eyes: No scleral injection HENT: No OP obstruction, c-collar in place MSK: no joint deformities.  Cardiovascular: Normal rate and regular rhythm.  Respiratory: Effort normal, non-labored breathing GI: Soft.  No distension. There is no tenderness.  Skin: WDI  Neuro: Mental Status: Patient is awake, alert, oriented  to person, place, month, year, and situation. Patient is able to give a clear and coherent history. No signs of aphasia or neglect Cranial Nerves: II: Visual Fields are full. Pupils are equal, round, and reactive to light.   III,IV, VI: EOMI without ptosis or diploplia.  V: Facial sensation is diminished on the left, she splits midline to both pinprick and vibration VII: She does not smile on the right, but when asked to puff her cheeks out, she consistently moves her face as if she is not quite certain what to do, and does not put air into her cheeks to either assess for air leak or puff out her cheeks. VIII: hearing is intact to voice Motor: Tone is normal. Bulk is normal. 5/5 strength was present on the right, on the left she has 0/5 weakness in the left leg and inconsistent 2-4/5 weakness of the left arm Sensory: Sensation is diminished throughout the left side  Cerebellar: She has no clear ataxia on the right   I have reviewed labs in epic and the results pertinent to this consultation are: CBG 128  I have reviewed the images obtained: MRI brain-negative, MRA-negative, CT-negative  Impression: 30 year old female with a history of stills disease with acute onset left-sided weakness, paresthesia, headache with photophobia nausea and vomiting.  With negative MRI/MRA, I think the most likely diagnosis would be complicated migraine.  There are some inconsistencies on exam, so I think functional neurological deficit would be another consideration, but given the headache I would favor pursuing treatment as complicated migraine for the time being.  She has reported symptoms of anxiety with Compazine in the past, but is tolerated Phenergan well and therefore this is what I will use.  Recommendations: 1) Phenergan 25 mg/Benadryl 12.5 mg 2) if she continues to have symptoms, could consider magnesium 2 g/Toradol if no contraindication/Depakote 1 g orally 3) clearance of C-spine per ER 4)  neurology will be available as needed.   Ritta Slot, MD [facility] [phone]  If 7pm- 7am, please page neurology on call as listed in AMION.

## 2020-12-06 NOTE — ED Provider Notes (Signed)
I received the patient in signout from Dr. Bernette Mayers, briefly the patient is a 30 year old female with a chief complaints of a syncopal event.  Some concern for left-sided weakness arrived as a code stroke.  Plan for lab work and MRI.  MRI is resulted and is without stroke.  The patient's weakness has resolved.  She has had multiple episodes of loose stools is complaining of some left knee pain and pain to the left elbow.  On exam no obvious fracture able to range without issue.  Blood work is resulted mild LFT elevation not significantly different than her labs at [facility].  She is requesting discharge home.  Will start on Imodium and Phenergan.   Melene Plan, DO 12/07/20 0210

## 2020-12-06 NOTE — ED Triage Notes (Signed)
Patient has h/x still's disease and was just discharged from being hospitalized at [facility] for 2 months. Patient was on the toilet at a restaurant today and had a syncopal episode followed by L UE and L LE numbness and weakness along with L facial droop

## 2020-12-07 DIAGNOSIS — G43109 Migraine with aura, not intractable, without status migrainosus: Secondary | ICD-10-CM | POA: Diagnosis not present

## 2020-12-07 LAB — DIFFERENTIAL
Abs Immature Granulocytes: 0.38 10*3/uL — ABNORMAL HIGH (ref 0.00–0.07)
Basophils Absolute: 0.1 10*3/uL (ref 0.0–0.1)
Basophils Relative: 1 %
Eosinophils Absolute: 0.2 10*3/uL (ref 0.0–0.5)
Eosinophils Relative: 1 %
Immature Granulocytes: 2 %
Lymphocytes Relative: 18 %
Lymphs Abs: 2.9 10*3/uL (ref 0.7–4.0)
Monocytes Absolute: 1.4 10*3/uL — ABNORMAL HIGH (ref 0.1–1.0)
Monocytes Relative: 9 %
Neutro Abs: 11 10*3/uL — ABNORMAL HIGH (ref 1.7–7.7)
Neutrophils Relative %: 69 %

## 2020-12-07 LAB — CBC
HCT: 34 % — ABNORMAL LOW (ref 36.0–46.0)
Hemoglobin: 10.4 g/dL — ABNORMAL LOW (ref 12.0–15.0)
MCH: 28 pg (ref 26.0–34.0)
MCHC: 30.6 g/dL (ref 30.0–36.0)
MCV: 91.4 fL (ref 80.0–100.0)
Platelets: 482 10*3/uL — ABNORMAL HIGH (ref 150–400)
RBC: 3.72 MIL/uL — ABNORMAL LOW (ref 3.87–5.11)
RDW: 15.2 % (ref 11.5–15.5)
WBC: 15.9 10*3/uL — ABNORMAL HIGH (ref 4.0–10.5)
nRBC: 0 % (ref 0.0–0.2)

## 2020-12-07 LAB — I-STAT CHEM 8, ED
BUN: 14 mg/dL (ref 6–20)
Calcium, Ion: 1.06 mmol/L — ABNORMAL LOW (ref 1.15–1.40)
Chloride: 104 mmol/L (ref 98–111)
Creatinine, Ser: 0.8 mg/dL (ref 0.44–1.00)
Glucose, Bld: 87 mg/dL (ref 70–99)
HCT: 32 % — ABNORMAL LOW (ref 36.0–46.0)
Hemoglobin: 10.9 g/dL — ABNORMAL LOW (ref 12.0–15.0)
Potassium: 3.5 mmol/L (ref 3.5–5.1)
Sodium: 140 mmol/L (ref 135–145)
TCO2: 24 mmol/L (ref 22–32)

## 2020-12-07 LAB — COMPREHENSIVE METABOLIC PANEL
ALT: 163 U/L — ABNORMAL HIGH (ref 0–44)
AST: 122 U/L — ABNORMAL HIGH (ref 15–41)
Albumin: 3.4 g/dL — ABNORMAL LOW (ref 3.5–5.0)
Alkaline Phosphatase: 100 U/L (ref 38–126)
Anion gap: 13 (ref 5–15)
BUN: 15 mg/dL (ref 6–20)
CO2: 22 mmol/L (ref 22–32)
Calcium: 9 mg/dL (ref 8.9–10.3)
Chloride: 103 mmol/L (ref 98–111)
Creatinine, Ser: 0.91 mg/dL (ref 0.44–1.00)
GFR, Estimated: >60 mL/min (ref >60)
Glucose, Bld: 79 mg/dL (ref 70–99)
Potassium: 3.5 mmol/L (ref 3.5–5.1)
Sodium: 138 mmol/L (ref 135–145)
Total Bilirubin: 0.6 mg/dL (ref 0.3–1.2)
Total Protein: 6 g/dL — ABNORMAL LOW (ref 6.5–8.1)

## 2020-12-07 LAB — PROTIME-INR
INR: 0.9 (ref 0.8–1.2)
Prothrombin Time: 12.6 seconds (ref 11.4–15.2)

## 2020-12-07 LAB — RESP PANEL BY RT-PCR (FLU A&B, COVID) ARPGX2
Influenza A by PCR: NEGATIVE
Influenza B by PCR: NEGATIVE
SARS Coronavirus 2 by RT PCR: NEGATIVE

## 2020-12-07 LAB — I-STAT BETA HCG BLOOD, ED (MC, WL, AP ONLY): I-stat hCG, quantitative: <5 m[IU]/mL (ref <5)

## 2020-12-07 LAB — APTT: aPTT: 24 seconds (ref 24–36)

## 2020-12-07 MED ORDER — LOPERAMIDE HCL 2 MG PO CAPS: 2.0000 mg | ORAL_CAPSULE | Freq: Four times a day (QID) | ORAL | 0 refills | Status: AC | PRN

## 2020-12-07 MED ORDER — PROMETHAZINE HCL 25 MG PO TABS: 25.0000 mg | ORAL_TABLET | Freq: Four times a day (QID) | ORAL | 0 refills | Status: AC | PRN

## 2020-12-07 MED ORDER — LOPERAMIDE HCL 2 MG PO CAPS
4.0000 mg | ORAL_CAPSULE | Freq: Once | ORAL | Status: AC
  Administered 2020-12-07: 4 mg via ORAL
  Filled 2020-12-07: qty 2

## 2020-12-07 MED ORDER — HEPARIN SOD (PORK) LOCK FLUSH 100 UNIT/ML IV SOLN
500.0000 [IU] | Freq: Once | INTRAVENOUS | Status: AC
  Administered 2020-12-07: 500 [IU]
  Filled 2020-12-07: qty 5

## 2020-12-07 MED ORDER — ONDANSETRON HCL 4 MG/2ML IJ SOLN
4.0000 mg | Freq: Once | INTRAMUSCULAR | Status: AC
  Administered 2020-12-07: 4 mg via INTRAVENOUS
  Filled 2020-12-07: qty 2

## 2020-12-07 NOTE — ED Notes (Signed)
Patient returned from MRI.

## 2020-12-07 NOTE — ED Notes (Addendum)
Unsuccessful at accessing port. Port will not draw back blood. Poor peripheral vasculature. Unsuccesful PIV attempts x2. MD aware.

## 2020-12-07 NOTE — Discharge Instructions (Signed)
Follow up with your doctors in the office.  Return for worsening symptoms.

## 2020-12-07 NOTE — ED Notes (Signed)
Patient ambulatory to bedside commode with assistance. Pt had large BM.

## 2020-12-07 NOTE — ED Notes (Signed)
Patient transported to MRI with this RN. Power port difficult to access. MD aware.
